# Patient Record
Sex: Female | Born: 1970 | Race: White | Hispanic: No | Marital: Married | Smoking: Never smoker
Health system: Southern US, Community
[De-identification: ages and names within clinical notes are randomized; demographics above are authoritative.]

## PROBLEM LIST (undated history)

## (undated) DIAGNOSIS — F419 Anxiety disorder, unspecified: Secondary | ICD-10-CM

## (undated) DIAGNOSIS — M1990 Unspecified osteoarthritis, unspecified site: Secondary | ICD-10-CM

## (undated) DIAGNOSIS — F32A Depression, unspecified: Secondary | ICD-10-CM

## (undated) DIAGNOSIS — T7840XA Allergy, unspecified, initial encounter: Secondary | ICD-10-CM

## (undated) HISTORY — PX: DILATION AND CURETTAGE OF UTERUS: SHX78

## (undated) HISTORY — DX: Allergy, unspecified, initial encounter: T78.40XA

## (undated) HISTORY — DX: Unspecified osteoarthritis, unspecified site: M19.90

## (undated) HISTORY — DX: Depression, unspecified: F32.A

## (undated) HISTORY — PX: MOUTH SURGERY: SHX715

## (undated) HISTORY — DX: Anxiety disorder, unspecified: F41.9

## (undated) HISTORY — PX: TONSILLECTOMY: SUR1361

---

## 2013-06-10 ENCOUNTER — Emergency Department: Payer: Self-pay | Admitting: Emergency Medicine

## 2013-06-10 LAB — URINALYSIS, COMPLETE
Bacteria: NONE SEEN
Bilirubin,UR: NEGATIVE
Glucose,UR: NEGATIVE mg/dL (ref 0–75)
LEUKOCYTE ESTERASE: NEGATIVE
Nitrite: NEGATIVE
Ph: 9 (ref 4.5–8.0)
Protein: NEGATIVE
Specific Gravity: 1.016 (ref 1.003–1.030)
Squamous Epithelial: <1

## 2013-06-10 LAB — CBC WITH DIFFERENTIAL/PLATELET
BASOS ABS: 0 10*3/uL (ref 0.0–0.1)
BASOS PCT: 0.2 %
Eosinophil #: 0 10*3/uL (ref 0.0–0.7)
Eosinophil %: 0.6 %
HCT: 39 % (ref 35.0–47.0)
HGB: 13.1 g/dL (ref 12.0–16.0)
Lymphocyte #: 0.2 10*3/uL — ABNORMAL LOW (ref 1.0–3.6)
Lymphocyte %: 3.4 %
MCH: 28.8 pg (ref 26.0–34.0)
MCHC: 33.5 g/dL (ref 32.0–36.0)
MCV: 86 fL (ref 80–100)
Monocyte #: 0.3 x10 3/mm (ref 0.2–0.9)
Monocyte %: 4.9 %
Neutrophil #: 6.2 10*3/uL (ref 1.4–6.5)
Neutrophil %: 90.9 %
PLATELETS: 139 10*3/uL — AB (ref 150–440)
RBC: 4.54 10*6/uL (ref 3.80–5.20)
RDW: 14.6 % — ABNORMAL HIGH (ref 11.5–14.5)
WBC: 6.9 10*3/uL (ref 3.6–11.0)

## 2013-06-10 LAB — COMPREHENSIVE METABOLIC PANEL
ALBUMIN: 3.6 g/dL (ref 3.4–5.0)
AST: 18 U/L (ref 15–37)
Alkaline Phosphatase: 90 U/L
Anion Gap: 8 (ref 7–16)
BILIRUBIN TOTAL: 0.9 mg/dL (ref 0.2–1.0)
BUN: 11 mg/dL (ref 7–18)
CALCIUM: 8.5 mg/dL (ref 8.5–10.1)
Chloride: 106 mmol/L (ref 98–107)
Co2: 25 mmol/L (ref 21–32)
Creatinine: 0.89 mg/dL (ref 0.60–1.30)
EGFR (African American): >60
EGFR (Non-African Amer.): >60
GLUCOSE: 111 mg/dL — AB (ref 65–99)
Osmolality: 278 (ref 275–301)
Potassium: 3.5 mmol/L (ref 3.5–5.1)
SGPT (ALT): 17 U/L (ref 12–78)
Sodium: 139 mmol/L (ref 136–145)
Total Protein: 7.4 g/dL (ref 6.4–8.2)

## 2013-06-10 LAB — LIPASE, BLOOD: Lipase: 136 U/L (ref 73–393)

## 2015-02-08 IMAGING — CT CT ABD-PELV W/ CM
2 of 5 series · 15 of 46 positions shown, 17 images · IV contrast (isovue)
Comparison: None.

CLINICAL DATA: Abdominal pain with severe cramping.  Fever.

EXAM:
CT ABDOMEN AND PELVIS WITH CONTRAST
TECHNIQUE: Multidetector CT imaging of the abdomen and pelvis was performed
using the standard protocol following bolus administration of
intravenous contrast.
CONTRAST:  100 cc Isovue 300 intravenous

[Series 2: routine abd pel with · axial · 0.64mm/px · z∈[-468,-68]mm · 12 of 90 slices shown, 14 images]
[im 5/90  soft-tissue]
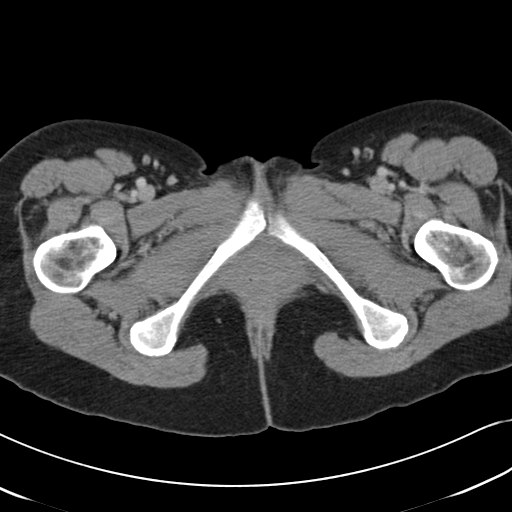
[im 5/90  bone]
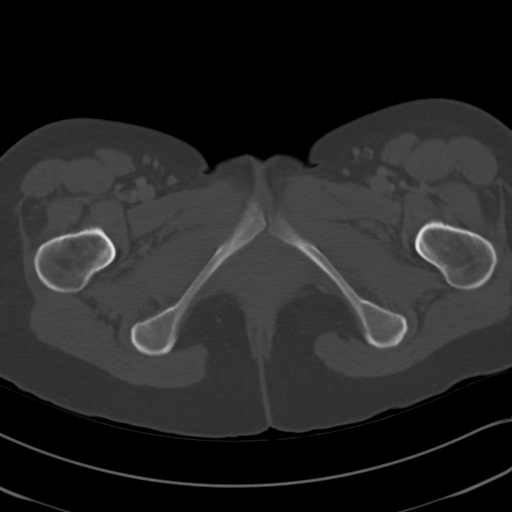
[im 15/90  soft-tissue]
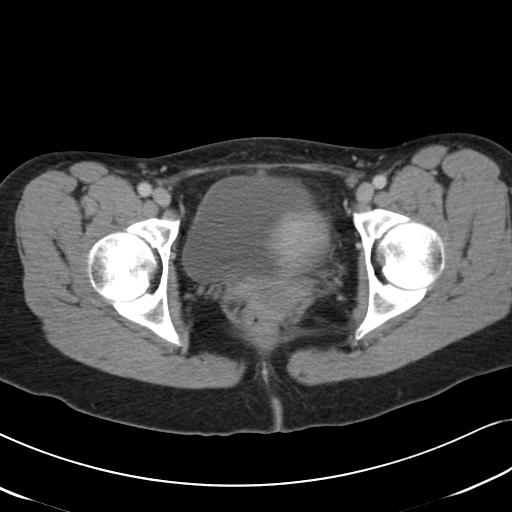
[im 19/90  soft-tissue]
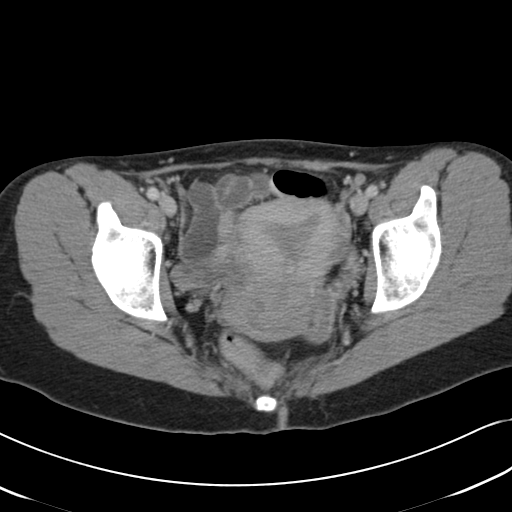
[im 29/90  soft-tissue]
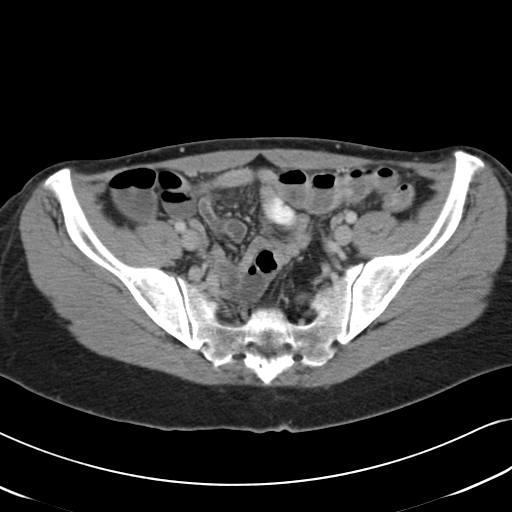
[im 33/90  soft-tissue]
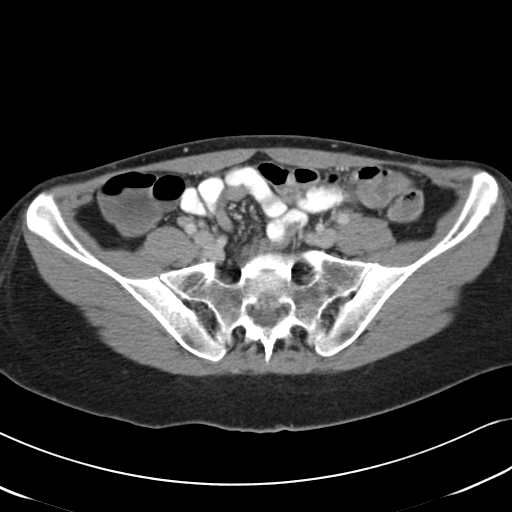
[im 43/90  soft-tissue]
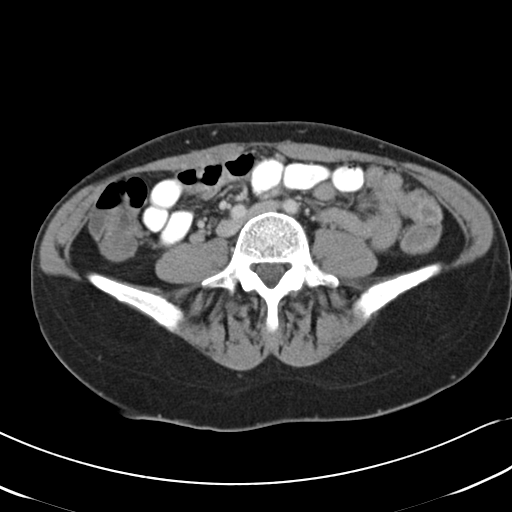
[im 47/90  soft-tissue]
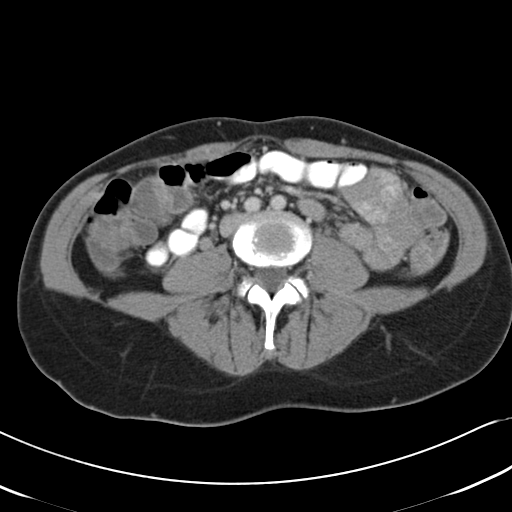
[im 57/90  soft-tissue]
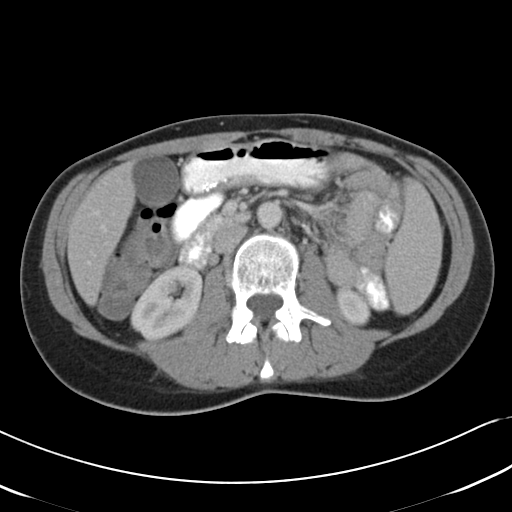
[im 61/90  soft-tissue]
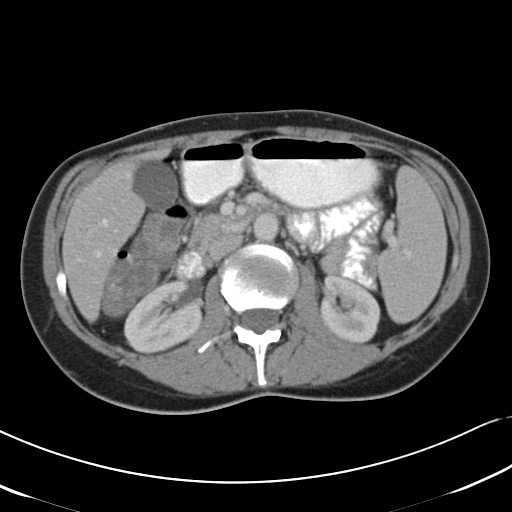
[im 61/90  bone]
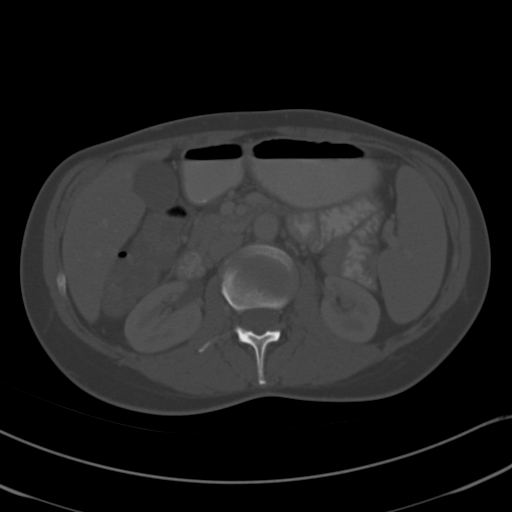
[im 71/90  soft-tissue]
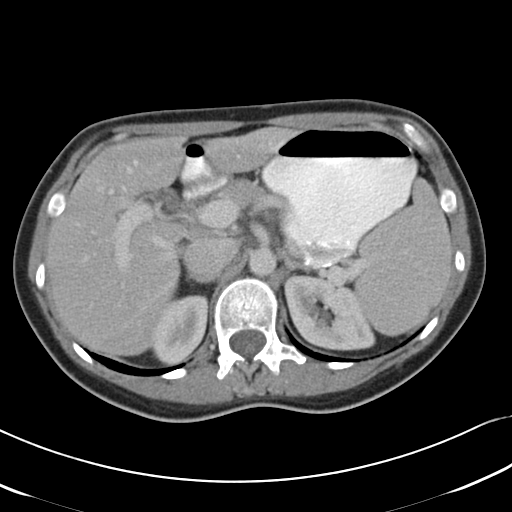
[im 75/90  soft-tissue]
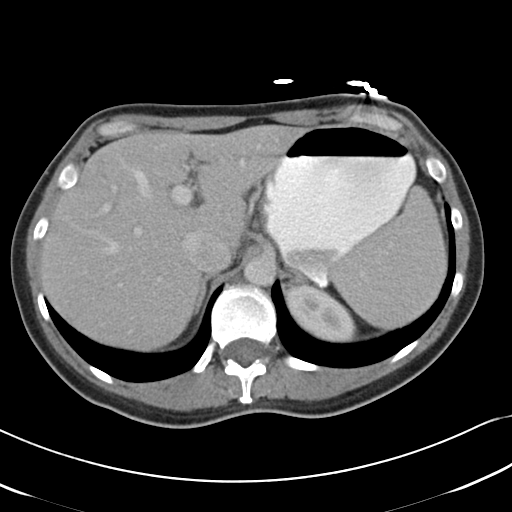
[im 85/90  soft-tissue]
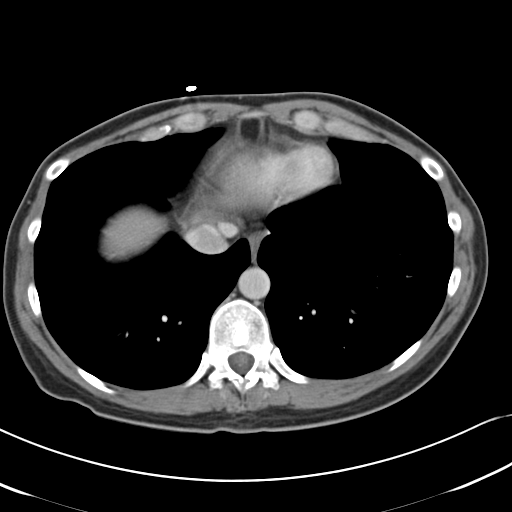

[Series 5: cor routine abd pel with · coronal · 0.60mm/px · 3 of 105 slices shown]
[im 35/105  soft-tissue]
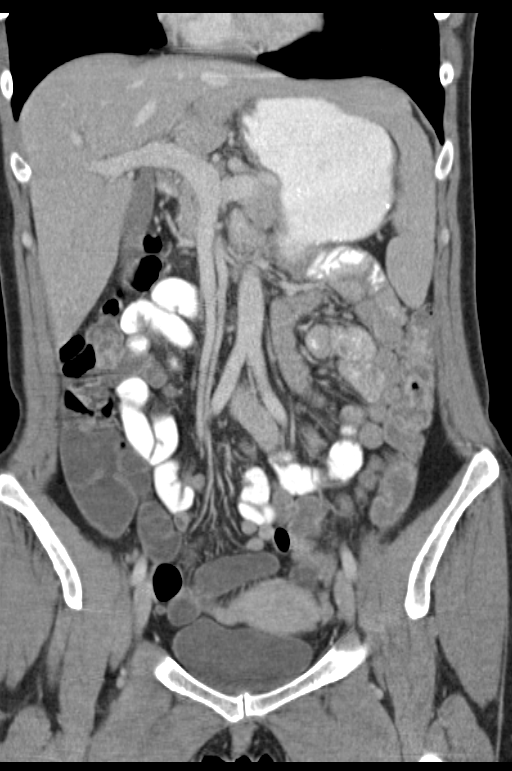
[im 47/105  soft-tissue]
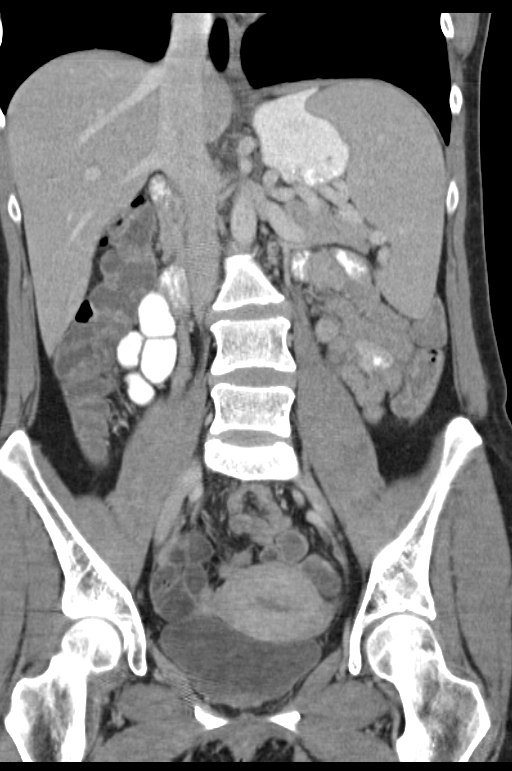
[im 58/105  soft-tissue]
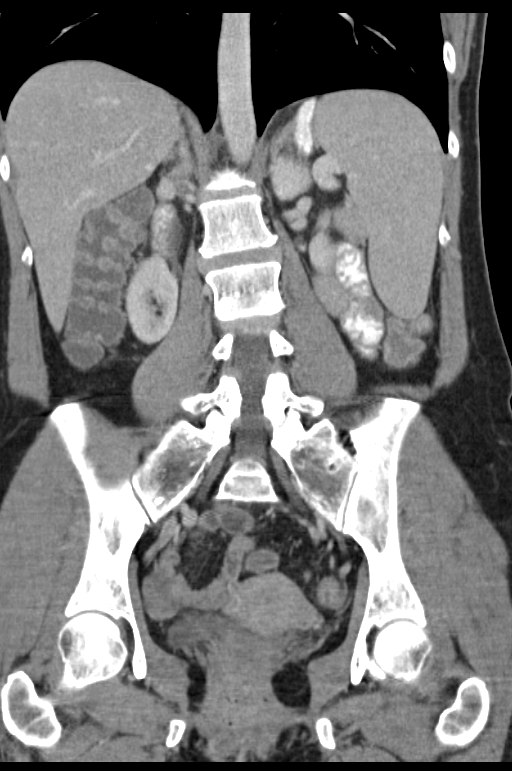

[15 of 46 positions shown; findings below may reference images not displayed]

FINDINGS: BODY WALL: Unremarkable.

LOWER CHEST: 4 mm pulmonary nodule in the anterior basilar segment
left lower lobe.

ABDOMEN/PELVIS:

Liver: No focal abnormality.

Biliary: No evidence of biliary obstruction or stone.

Pancreas: Unremarkable.

Spleen: Unremarkable.

Adrenals: Unremarkable.

Kidneys and ureters: No hydronephrosis or stone.

Bladder: Unremarkable.

Reproductive: There is an 8 mm focus of avid enhancement within the
central endometrial canal of the lower uterine segment.

Bowel: No obstruction. Normal appendix.

Retroperitoneum: No mass or adenopathy.

Peritoneum: No free fluid or gas.

Vascular: No acute abnormality.

OSSEOUS: No acute abnormalities.
IMPRESSION: 1. No acute intra-abdominal abnormality.
2. 8 mm enhancing nodule within the endometrial canal of the lower
uterine segment, which could represent a polyp or other focal
endometrial abnormality. Especially if there is dysfunctional
uterine bleeding, further evaluation with nonemergent pelvic
sonography recommended (preferably timed for shortly after menses).
3. 4 mm pulmonary nodule in the left lower lobe. Recommendations
noted below.

RECOMMENDATIONS:
If the patient is at high risk for bronchogenic carcinoma, follow-up
chest CT at 1 year is recommended. If the patient is at low risk, no
follow-up is needed. This recommendation follows the consensus
statement: Guidelines for Management of Small Pulmonary Nodules
Detected on CT Scans: A Statement from the [HOSPITAL] as

## 2019-02-27 LAB — COLOGUARD: Cologuard: NEGATIVE

## 2019-03-20 LAB — RESULTS CONSOLE HPV: CHL HPV: NEGATIVE

## 2021-01-21 DIAGNOSIS — F5104 Psychophysiologic insomnia: Secondary | ICD-10-CM | POA: Diagnosis not present

## 2021-01-21 DIAGNOSIS — F320 Major depressive disorder, single episode, mild: Secondary | ICD-10-CM | POA: Diagnosis not present

## 2021-05-21 ENCOUNTER — Encounter: Payer: Self-pay | Admitting: Internal Medicine

## 2021-05-21 ENCOUNTER — Other Ambulatory Visit: Payer: Self-pay

## 2021-05-21 ENCOUNTER — Ambulatory Visit (INDEPENDENT_AMBULATORY_CARE_PROVIDER_SITE_OTHER): Payer: BC Managed Care – PPO | Admitting: Internal Medicine

## 2021-05-21 VITALS — BP 109/68 | HR 72 | Temp 97.5°F | Ht 70.0 in | Wt 140.0 lb

## 2021-05-21 DIAGNOSIS — Z1231 Encounter for screening mammogram for malignant neoplasm of breast: Secondary | ICD-10-CM | POA: Diagnosis not present

## 2021-05-21 DIAGNOSIS — F5104 Psychophysiologic insomnia: Secondary | ICD-10-CM

## 2021-05-21 DIAGNOSIS — F419 Anxiety disorder, unspecified: Secondary | ICD-10-CM | POA: Diagnosis not present

## 2021-05-21 DIAGNOSIS — M7918 Myalgia, other site: Secondary | ICD-10-CM | POA: Insufficient documentation

## 2021-05-21 DIAGNOSIS — G4700 Insomnia, unspecified: Secondary | ICD-10-CM | POA: Insufficient documentation

## 2021-05-21 DIAGNOSIS — R1013 Epigastric pain: Secondary | ICD-10-CM

## 2021-05-21 DIAGNOSIS — Z0001 Encounter for general adult medical examination with abnormal findings: Secondary | ICD-10-CM | POA: Diagnosis not present

## 2021-05-21 DIAGNOSIS — F32A Depression, unspecified: Secondary | ICD-10-CM

## 2021-05-21 DIAGNOSIS — K591 Functional diarrhea: Secondary | ICD-10-CM

## 2021-05-21 DIAGNOSIS — R140 Abdominal distension (gaseous): Secondary | ICD-10-CM

## 2021-05-21 NOTE — Patient Instructions (Signed)
Health Maintenance for Postmenopausal Women ?Menopause is a normal process in which your ability to get pregnant comes to an end. This process happens slowly over many months or years, usually between the ages of 48 and 55. Menopause is complete when you have missed your menstrual period for 12 months. ?It is important to talk with your health care provider about some of the most common conditions that affect women after menopause (postmenopausal women). These include heart disease, cancer, and bone loss (osteoporosis). Adopting a healthy lifestyle and getting preventive care can help to promote your health and wellness. The actions you take can also lower your chances of developing some of these common conditions. ?What are the signs and symptoms of menopause? ?During menopause, you may have the following symptoms: ?Hot flashes. These can be moderate or severe. ?Night sweats. ?Decrease in sex drive. ?Mood swings. ?Headaches. ?Tiredness (fatigue). ?Irritability. ?Memory problems. ?Problems falling asleep or staying asleep. ?Talk with your health care provider about treatment options for your symptoms. ?Do I need hormone replacement therapy? ?Hormone replacement therapy is effective in treating symptoms that are caused by menopause, such as hot flashes and night sweats. ?Hormone replacement carries certain risks, especially as you become older. If you are thinking about using estrogen or estrogen with progestin, discuss the benefits and risks with your health care provider. ?How can I reduce my risk for heart disease and stroke? ?The risk of heart disease, heart attack, and stroke increases as you age. One of the causes may be a change in the body's hormones during menopause. This can affect how your body uses dietary fats, triglycerides, and cholesterol. Heart attack and stroke are medical emergencies. There are many things that you can do to help prevent heart disease and stroke. ?Watch your blood pressure ?High  blood pressure causes heart disease and increases the risk of stroke. This is more likely to develop in people who have high blood pressure readings or are overweight. ?Have your blood pressure checked: ?Every 3-5 years if you are 18-39 years of age. ?Every year if you are 40 years old or older. ?Eat a healthy diet ? ?Eat a diet that includes plenty of vegetables, fruits, low-fat dairy products, and lean protein. ?Do not eat a lot of foods that are high in solid fats, added sugars, or sodium. ?Get regular exercise ?Get regular exercise. This is one of the most important things you can do for your health. Most adults should: ?Try to exercise for at least 150 minutes each week. The exercise should increase your heart rate and make you sweat (moderate-intensity exercise). ?Try to do strengthening exercises at least twice each week. Do these in addition to the moderate-intensity exercise. ?Spend less time sitting. Even light physical activity can be beneficial. ?Other tips ?Work with your health care provider to achieve or maintain a healthy weight. ?Do not use any products that contain nicotine or tobacco. These products include cigarettes, chewing tobacco, and vaping devices, such as e-cigarettes. If you need help quitting, ask your health care provider. ?Know your numbers. Ask your health care provider to check your cholesterol and your blood sugar (glucose). Continue to have your blood tested as directed by your health care provider. ?Do I need screening for cancer? ?Depending on your health history and family history, you may need to have cancer screenings at different stages of your life. This may include screening for: ?Breast cancer. ?Cervical cancer. ?Lung cancer. ?Colorectal cancer. ?What is my risk for osteoporosis? ?After menopause, you may be   at increased risk for osteoporosis. Osteoporosis is a condition in which bone destruction happens more quickly than new bone creation. To help prevent osteoporosis or  the bone fractures that can happen because of osteoporosis, you may take the following actions: ?If you are 19-50 years old, get at least 1,000 mg of calcium and at least 600 international units (IU) of vitamin D per day. ?If you are older than age 50 but younger than age 70, get at least 1,200 mg of calcium and at least 600 international units (IU) of vitamin D per day. ?If you are older than age 70, get at least 1,200 mg of calcium and at least 800 international units (IU) of vitamin D per day. ?Smoking and drinking excessive alcohol increase the risk of osteoporosis. Eat foods that are rich in calcium and vitamin D, and do weight-bearing exercises several times each week as directed by your health care provider. ?How does menopause affect my mental health? ?Depression may occur at any age, but it is more common as you become older. Common symptoms of depression include: ?Feeling depressed. ?Changes in sleep patterns. ?Changes in appetite or eating patterns. ?Feeling an overall lack of motivation or enjoyment of activities that you previously enjoyed. ?Frequent crying spells. ?Talk with your health care provider if you think that you are experiencing any of these symptoms. ?General instructions ?See your health care provider for regular wellness exams and vaccines. This may include: ?Scheduling regular health, dental, and eye exams. ?Getting and maintaining your vaccines. These include: ?Influenza vaccine. Get this vaccine each year before the flu season begins. ?Pneumonia vaccine. ?Shingles vaccine. ?Tetanus, diphtheria, and pertussis (Tdap) booster vaccine. ?Your health care provider may also recommend other immunizations. ?Tell your health care provider if you have ever been abused or do not feel safe at home. ?Summary ?Menopause is a normal process in which your ability to get pregnant comes to an end. ?This condition causes hot flashes, night sweats, decreased interest in sex, mood swings, headaches, or lack  of sleep. ?Treatment for this condition may include hormone replacement therapy. ?Take actions to keep yourself healthy, including exercising regularly, eating a healthy diet, watching your weight, and checking your blood pressure and blood sugar levels. ?Get screened for cancer and depression. Make sure that you are up to date with all your vaccines. ?This information is not intended to replace advice given to you by your health care provider. Make sure you discuss any questions you have with your health care provider. ?Document Revised: 08/18/2020 Document Reviewed: 08/18/2020 ?Elsevier Patient Education ? 2022 [employer] ? ?

## 2021-05-21 NOTE — Progress Notes (Signed)
HPI  Pt presents to the clinic today to establish care and for management of the conditions listed below.  Chronic Muscle Pain: Generalized but mainly neck and back. She takes Flexeril at bedtime with good relief of symptoms. She stretches often.  Anxiety and Depression: Chronic, managed on Escitalopram. She is not currently seeing a therapist. She denies SI/HI.  Insomnia: She has difficulty falling asleep and staying asleep. She takes Lunesta with good relief of symptoms.   She also reports epigastric pain, bloating and intermittent diarrhea.  She feels like she has irritable bowel syndrome.  She denies reflux.  She has been treated for H. pylori in the past and reports this feels the same.  She is not currently taking any medication for this.  Flu: 01/2021 Tetanus: 06/2013 Covid: never Shingrix: 02/2020 Pap smear: 03/2019 Mammogram: 05/2020 Cologuard: 02/2019 Vision screening: annually Dentist: biannually  Diet: She does eat meat. She consumes fruits and veggies. She tries to avoid fried foods. She drinks mostly water and coffee. Exercise: Weights, walking  No past medical history on file.  Current Outpatient Medications  Medication Sig Dispense Refill   cyclobenzaprine (FLEXERIL) 10 MG tablet Take by mouth.     escitalopram (LEXAPRO) 20 MG tablet Take 20 mg by mouth daily.     Eszopiclone 3 MG TABS Take 3 mg by mouth at bedtime as needed.     Ibuprofen 200 MG CAPS Take by mouth.     L-Lysine 1000 MG TABS Take by mouth.     Magnesium 500 MG TABS Take by mouth.     No current facility-administered medications for this visit.    Allergies  Allergen Reactions   Diazepam Rash   Tetracycline Nausea Only    Family History  Problem Relation Age of Onset   Healthy Mother    Drug abuse Father    Depression Father    Depression Brother    Diabetes type II Brother    Hypertension Brother    Glaucoma Brother    Glaucoma Paternal Grandfather    Colon cancer Neg Hx    Breast  cancer Neg Hx    Ovarian cancer Neg Hx     Social History   Socioeconomic History   Marital status: Married    Spouse name: Not on file   Number of children: Not on file   Years of education: Not on file   Highest education level: Not on file  Occupational History   Not on file  Tobacco Use   Smoking status: Never   Smokeless tobacco: Never  Vaping Use   Vaping Use: Never used  Substance and Sexual Activity   Alcohol use: Not on file   Drug use: Never   Sexual activity: Not on file  Other Topics Concern   Not on file  Social History Narrative   Not on file   Social Determinants of Health   Financial Resource Strain: Not on file  Food Insecurity: Not on file  Transportation Needs: Not on file  Physical Activity: Not on file  Stress: Not on file  Social Connections: Not on file  Intimate Partner Violence: Not on file    ROS:  Constitutional: Denies fever, malaise, fatigue, headache or abrupt weight changes.  HEENT: Denies eye pain, eye redness, ear pain, ringing in the ears, wax buildup, runny nose, nasal congestion, bloody nose, or sore throat. Respiratory: Denies difficulty breathing, shortness of breath, cough or sputum production.   Cardiovascular: Denies chest pain, chest tightness, palpitations or swelling  in the hands or feet.  Gastrointestinal: Patient reports epigastric pain, bloating and diarrhea.  Denies constipation, or blood in the stool.  GU: Denies frequency, urgency, pain with urination, blood in urine, odor or discharge. Musculoskeletal: Patient reports chronic muscle pain.  Denies decrease in range of motion, difficulty with gait, or joint pain and swelling.  Skin: Denies redness, rashes, lesions or ulcercations.  Neurological: Patient reports insomnia.  Denies dizziness, difficulty with memory, difficulty with speech or problems with balance and coordination.  Psych: Patient has a history of anxiety and depression.  Denies SI/HI.  No other specific  complaints in a complete review of systems (except as listed in HPI above).  PE:  BP 109/68 (BP Location: Left Arm, Patient Position: Sitting, Cuff Size: Normal)    Pulse 72    Temp (!) 97.5 F (36.4 C) (Temporal)    Ht $R'5\' 10"'DB$  (1.778 m)    Wt 140 lb (63.5 kg)    SpO2 100%    BMI 20.09 kg/m  Wt Readings from Last 3 Encounters:  05/21/21 140 lb (63.5 kg)    General: Appears her stated age, well developed, well nourished in NAD. HEENT: Head: normal shape and size; Eyes: EOMs intact;  Neck: Neck supple, trachea midline. No masses, lumps or thyromegaly present.  Cardiovascular: Normal rate and rhythm. S1,S2 noted.  No murmur, rubs or gallops noted. No JVD or BLE edema. No carotid bruits noted. Pulmonary/Chest: Normal effort and positive vesicular breath sounds. No respiratory distress. No wheezes, rales or ronchi noted.  Abdomen: Soft and nontender. Normal bowel sound.. No distention or masses noted. Liver, spleen and kidneys non palpable. Musculoskeletal: Strength 5/5 BUE/BLE. No difficulty with gait.  Neurological: Alert and oriented. Cranial nerves II-XII grossly intact. Coordination normal.  Psychiatric: Mood and affect normal. Behavior is normal. Judgment and thought content normal.    BMET    Component Value Date/Time   NA 139 06/10/2013 2005   K 3.5 06/10/2013 2005   CL 106 06/10/2013 2005   CO2 25 06/10/2013 2005   GLUCOSE 111 (H) 06/10/2013 2005   BUN 11 06/10/2013 2005   CREATININE 0.89 06/10/2013 2005   CALCIUM 8.5 06/10/2013 2005   GFRNONAA >60 06/10/2013 2005   GFRAA >60 06/10/2013 2005    Lipid Panel  No results found for: CHOL, TRIG, HDL, CHOLHDL, VLDL, LDLCALC  CBC    Component Value Date/Time   WBC 6.9 06/10/2013 2005   RBC 4.54 06/10/2013 2005   HGB 13.1 06/10/2013 2005   HCT 39.0 06/10/2013 2005   PLT 139 (L) 06/10/2013 2005   MCV 86 06/10/2013 2005   MCH 28.8 06/10/2013 2005   MCHC 33.5 06/10/2013 2005   RDW 14.6 (H) 06/10/2013 2005   LYMPHSABS 0.2  (L) 06/10/2013 2005   MONOABS 0.3 06/10/2013 2005   EOSABS 0.0 06/10/2013 2005   BASOSABS 0.0 06/10/2013 2005    Hgb A1C No results found for: HGBA1C   Assessment and Plan:  Preventative Health Maintenance:  Flu shot UTD Tetanus UTD Encouraged her to get her COVID-vaccine Advised her to get her second Shingrix vaccine Pap smear UTD Mammogram ordered-she will call to schedule Colon screening due 02/2022 Encouraged her to consume a balanced diet and exercise regimen Advised her to see an eye doctor and dentist annually We will check CBC, c-Met, lipid, A1c today  Epigastric Pain, Abdominal Bloating, dDarrhea:  Could have 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 pylori today  RTC in 6 months,  follow-up chronic conditions Webb Silversmith, NP This visit occurred during the SARS-CoV-2 public health emergency.  Safety protocols were in place, including screening questions prior to the visit, additional usage of staff PPE, and extensive cleaning of exam room while observing appropriate contact time as indicated for disinfecting solutions.

## 2021-05-21 NOTE — Assessment & Plan Note (Signed)
Encourage daily stretching Continue Flexeril as needed

## 2021-05-21 NOTE — Assessment & Plan Note (Signed)
Continue Lunesta We will monitor

## 2021-05-21 NOTE — Assessment & Plan Note (Signed)
Controlled on Escitalopram Support offered 

## 2021-05-22 ENCOUNTER — Other Ambulatory Visit: Payer: Self-pay

## 2021-05-22 DIAGNOSIS — Z0001 Encounter for general adult medical examination with abnormal findings: Secondary | ICD-10-CM

## 2021-05-22 LAB — COMPLETE METABOLIC PANEL WITH GFR
AG Ratio: 1.8 (calc) (ref 1.0–2.5)
ALT: 12 U/L (ref 6–29)
AST: 19 U/L (ref 10–35)
Albumin: 4.3 g/dL (ref 3.6–5.1)
Alkaline phosphatase (APISO): 100 U/L (ref 37–153)
BUN: 9 mg/dL (ref 7–25)
CO2: 32 mmol/L (ref 20–32)
Calcium: 9.5 mg/dL (ref 8.6–10.4)
Chloride: 103 mmol/L (ref 98–110)
Creat: 0.94 mg/dL (ref 0.50–1.03)
Globulin: 2.4 g/dL (calc) (ref 1.9–3.7)
Glucose, Bld: 88 mg/dL (ref 65–99)
Potassium: 4.4 mmol/L (ref 3.5–5.3)
Sodium: 139 mmol/L (ref 135–146)
Total Bilirubin: 0.8 mg/dL (ref 0.2–1.2)
Total Protein: 6.7 g/dL (ref 6.1–8.1)
eGFR: 73 mL/min/{1.73_m2} (ref >=60)

## 2021-05-22 LAB — H. PYLORI BREATH TEST: H. pylori Breath Test: NOT DETECTED

## 2021-05-22 NOTE — Addendum Note (Signed)
Addended by: Lorre Munroe on: 05/22/2021 03:17 PM   Modules accepted: Orders

## 2021-06-24 ENCOUNTER — Encounter: Payer: Self-pay | Admitting: Internal Medicine

## 2021-08-25 ENCOUNTER — Encounter: Payer: Self-pay | Admitting: Internal Medicine

## 2021-08-26 DIAGNOSIS — Z1231 Encounter for screening mammogram for malignant neoplasm of breast: Secondary | ICD-10-CM | POA: Diagnosis not present

## 2021-08-26 MED ORDER — ESZOPICLONE 3 MG PO TABS: 3.0000 mg | ORAL_TABLET | Freq: Every evening | ORAL | 0 refills | Status: DC | PRN

## 2021-08-26 MED ORDER — CYCLOBENZAPRINE HCL 10 MG PO TABS: 10.0000 mg | ORAL_TABLET | Freq: Every day | ORAL | 0 refills | Status: DC | PRN

## 2021-08-26 MED ORDER — ESCITALOPRAM OXALATE 20 MG PO TABS: 20.0000 mg | ORAL_TABLET | Freq: Every day | ORAL | 1 refills | Status: DC

## 2021-10-12 ENCOUNTER — Other Ambulatory Visit: Payer: Self-pay | Admitting: Internal Medicine

## 2021-10-14 NOTE — Telephone Encounter (Signed)
Requested medication (s) are due for refill today: Yes  Requested medication (s) are on the active medication list: Yes  Last refill:  08/26/21  Future visit scheduled: Yes  Notes to clinic:  See request.    Requested Prescriptions  Pending Prescriptions Disp Refills   Eszopiclone 3 MG TABS [Pharmacy Med Name: ESZOPICLONE  TAB 3MG ] 90 tablet 0    Sig: TAKE 1 TABLET AT BEDTIME ASNEEDED     Not Delegated - Psychiatry:  Anxiolytics/Hypnotics Failed - 10/12/2021  2:59 PM      Failed - This refill cannot be delegated      Failed - Urine Drug Screen completed in last 360 days      Passed - Valid encounter within last 6 months    Recent Outpatient Visits           4 months ago Encounter for general adult medical examination with abnormal findings   [facility], NP       Future Appointments             In 1 month Baity, Salvadore Oxford, NP [facility], [facility]             cyclobenzaprine (FLEXERIL) 10 MG tablet [Pharmacy Med Name: CYCLOBENZAPR TAB 10MG ] 30 tablet 0    Sig: TAKE 1 TABLET DAILY AS     NEEDED FOR MUSCLE SPASMS     Not Delegated - Analgesics:  Muscle Relaxants Failed - 10/12/2021  2:59 PM      Failed - This refill cannot be delegated      Passed - Valid encounter within last 6 months    Recent Outpatient Visits           4 months ago Encounter for general adult medical examination with abnormal findings   [facility], [facility], NP       Future Appointments             In 1 month [place], Salvadore Oxford, NP [facility], [facility]

## 2021-11-01 ENCOUNTER — Other Ambulatory Visit: Payer: Self-pay | Admitting: Internal Medicine

## 2021-11-03 NOTE — Telephone Encounter (Signed)
Requested medication (s) are due for refill today - no  Requested medication (s) are on the active medication list -yes  Future visit scheduled -yes  Last refill: 10/15/21 #30  Notes to clinic: non delegated Rx  Requested Prescriptions  Pending Prescriptions Disp Refills   cyclobenzaprine (FLEXERIL) 10 MG tablet [Pharmacy Med Name: CYCLOBENZAPR TAB 10MG ] 30 tablet 0    Sig: TAKE 1 TABLET DAILY AS     NEEDED FOR MUSCLE SPASMS     Not Delegated - Analgesics:  Muscle Relaxants Failed - 11/01/2021  8:11 PM      Failed - This refill cannot be delegated      Passed - Valid encounter within last 6 months    Recent Outpatient Visits           5 months ago Encounter for general adult medical examination with abnormal findings   [facility], NP       Future Appointments             In 4 weeks Salvadore Oxford, Sampson Si, NP [facility], [facility]               Requested Prescriptions  Pending Prescriptions Disp Refills   cyclobenzaprine (FLEXERIL) 10 MG tablet [Pharmacy Med Name: CYCLOBENZAPR TAB 10MG ] 30 tablet 0    Sig: TAKE 1 TABLET DAILY AS     NEEDED FOR MUSCLE SPASMS     Not Delegated - Analgesics:  Muscle Relaxants Failed - 11/01/2021  8:11 PM      Failed - This refill cannot be delegated      Passed - Valid encounter within last 6 months    Recent Outpatient Visits           5 months ago Encounter for general adult medical examination with abnormal findings   [facility], [facility], NP       Future Appointments             In 4 weeks [place], Salvadore Oxford, NP [facility], [facility]

## 2021-12-01 ENCOUNTER — Encounter: Payer: Self-pay | Admitting: Internal Medicine

## 2021-12-01 ENCOUNTER — Ambulatory Visit (INDEPENDENT_AMBULATORY_CARE_PROVIDER_SITE_OTHER): Payer: BC Managed Care – PPO | Admitting: Internal Medicine

## 2021-12-01 VITALS — BP 118/82 | HR 74 | Temp 97.3°F | Wt 151.0 lb

## 2021-12-01 DIAGNOSIS — Z1211 Encounter for screening for malignant neoplasm of colon: Secondary | ICD-10-CM | POA: Diagnosis not present

## 2021-12-01 DIAGNOSIS — Z0001 Encounter for general adult medical examination with abnormal findings: Secondary | ICD-10-CM | POA: Diagnosis not present

## 2021-12-01 DIAGNOSIS — R7309 Other abnormal glucose: Secondary | ICD-10-CM | POA: Diagnosis not present

## 2021-12-01 DIAGNOSIS — K580 Irritable bowel syndrome with diarrhea: Secondary | ICD-10-CM

## 2021-12-01 DIAGNOSIS — F5104 Psychophysiologic insomnia: Secondary | ICD-10-CM

## 2021-12-01 DIAGNOSIS — M7918 Myalgia, other site: Secondary | ICD-10-CM | POA: Diagnosis not present

## 2021-12-01 DIAGNOSIS — F419 Anxiety disorder, unspecified: Secondary | ICD-10-CM

## 2021-12-01 DIAGNOSIS — F32A Depression, unspecified: Secondary | ICD-10-CM

## 2021-12-01 DIAGNOSIS — K589 Irritable bowel syndrome without diarrhea: Secondary | ICD-10-CM | POA: Insufficient documentation

## 2021-12-01 NOTE — Assessment & Plan Note (Signed)
Continue Lunesta 

## 2021-12-01 NOTE — Assessment & Plan Note (Signed)
Continue escitalopram Support offered 

## 2021-12-01 NOTE — Assessment & Plan Note (Signed)
Continue cyclobenzaprine and stretching

## 2021-12-01 NOTE — Progress Notes (Signed)
Subjective:    Patient ID: Briana Bauer, female    DOB: [birth year], 51 y.o.   MRN: [id number]  HPI  Patient presents to clinic today for 65-month follow-up of chronic conditions.  Fibromyalgia: Managed with Cyclobenzaprine at bedtime with good relief of symptoms.  She reports she stretches often.  Anxiety and Depression: Chronic, managed on Escitalopram.  She is not currently seeing a therapist.  She denies SI/HI.  Insomnia: She has difficulty falling and staying asleep.  She takes Lunesta with good relief of symptoms.  There is no sleep study on file.  IBS: Mainly diarrhea. She tries to manage this with diet. There is no colonoscopy on file.  She does note that she recently had dental implant surgery.  She was on Dexamethasone and Amoxicillin.  She reports she has felt "funny".  She does not have a follow-up with the oral surgeon until December.  Review of Systems     No past medical history on file.  Current Outpatient Medications  Medication Sig Dispense Refill   cyclobenzaprine (FLEXERIL) 10 MG tablet TAKE 1 TABLET DAILY AS     NEEDED FOR MUSCLE SPASMS 30 tablet 0   escitalopram (LEXAPRO) 20 MG tablet Take 1 tablet (20 mg total) by mouth daily. 90 tablet 1   Eszopiclone 3 MG TABS TAKE 1 TABLET AT BEDTIME ASNEEDED 90 tablet 0   Ibuprofen 200 MG CAPS Take by mouth.     L-Lysine 1000 MG TABS Take by mouth.     Magnesium 500 MG TABS Take by mouth.     No current facility-administered medications for this visit.    Allergies  Allergen Reactions   Diazepam Rash   Tetracycline Nausea Only    Family History  Problem Relation Age of Onset   Healthy Mother    Drug abuse Father    Depression Father    Depression Brother    Diabetes type II Brother    Hypertension Brother    Glaucoma Brother    Glaucoma Paternal Grandfather    Colon cancer Neg Hx    Breast cancer Neg Hx    Ovarian cancer Neg Hx     Social History   Socioeconomic History   Marital status: Married     Spouse name: Not on file   Number of children: Not on file   Years of education: Not on file   Highest education level: Not on file  Occupational History   Not on file  Tobacco Use   Smoking status: Never   Smokeless tobacco: Never  Vaping Use   Vaping Use: Never used  Substance and Sexual Activity   Alcohol use: Not on file   Drug use: Never   Sexual activity: Not on file  Other Topics Concern   Not on file  Social History Narrative   Not on file   Social Determinants of Health   Financial Resource Strain: Not on file  Food Insecurity: Not on file  Transportation Needs: Not on file  Physical Activity: Not on file  Stress: Not on file  Social Connections: Not on file  Intimate Partner Violence: Not on file     Constitutional: Denies fever, malaise, fatigue, headache or abrupt weight changes.  HEENT: Denies eye pain, eye redness, ear pain, ringing in the ears, wax buildup, runny nose, nasal congestion, bloody nose, or sore throat. Respiratory: Denies difficulty breathing, shortness of breath, cough or sputum production.   Cardiovascular: Denies chest pain, chest tightness, palpitations or swelling in the  hands or feet.  Gastrointestinal: Patient reports intermittent diarrhea.  Denies abdominal pain, bloating, constipation, or blood in the stool.  GU: Denies urgency, frequency, pain with urination, burning sensation, blood in urine, odor or discharge. Musculoskeletal: Patient reports chronic muscle pain.  Denies decrease in range of motion, difficulty with gait, or joint pain and swelling.  Skin: Denies redness, rashes, lesions or ulcercations.  Neurological: Patient reports insomnia.  Denies dizziness, difficulty with memory, difficulty with speech or problems with balance and coordination.  Psych: Patient has a history of anxiety and depression.  Denies SI/HI.  No other specific complaints in a complete review of systems (except as listed in HPI above).  Objective:    Physical Exam BP 118/82 (BP Location: Left Arm, Patient Position: Sitting, Cuff Size: Normal)   Pulse 74   Temp (!) 97.3 F (36.3 C) (Temporal)   Wt 151 lb (68.5 kg)   SpO2 99%   BMI 21.67 kg/m   Wt Readings from Last 3 Encounters:  05/21/21 140 lb (63.5 kg)    General: Appears her stated age, well developed, well nourished in NAD. Skin: Warm, dry and intact.  Cardiovascular: Normal rate and rhythm. S1,S2 noted.  No murmur, rubs or gallops noted.  Pulmonary/Chest: Normal effort and positive vesicular breath sounds. No respiratory distress. No wheezes, rales or ronchi noted.  Abdomen: Normal bowel sounds. Musculoskeletal: Strength 5/5 BUE/BLE.  No difficulty with gait.  Neurological: Alert and oriented.  Psychiatric: Mood and affect normal. Behavior is normal. Judgment and thought content normal.    BMET    Component Value Date/Time   NA 139 05/21/2021 1114   NA 139 06/10/2013 2005   K 4.4 05/21/2021 1114   K 3.5 06/10/2013 2005   CL 103 05/21/2021 1114   CL 106 06/10/2013 2005   CO2 32 05/21/2021 1114   CO2 25 06/10/2013 2005   GLUCOSE 88 05/21/2021 1114   GLUCOSE 111 (H) 06/10/2013 2005   BUN 9 05/21/2021 1114   BUN 11 06/10/2013 2005   CREATININE 0.94 05/21/2021 1114   CALCIUM 9.5 05/21/2021 1114   CALCIUM 8.5 06/10/2013 2005   GFRNONAA >60 06/10/2013 2005   GFRAA >60 06/10/2013 2005    Lipid Panel  No results found for: "CHOL", "TRIG", "HDL", "CHOLHDL", "VLDL", "LDLCALC"  CBC    Component Value Date/Time   WBC 6.9 06/10/2013 2005   RBC 4.54 06/10/2013 2005   HGB 13.1 06/10/2013 2005   HCT 39.0 06/10/2013 2005   PLT 139 (L) 06/10/2013 2005   MCV 86 06/10/2013 2005   MCH 28.8 06/10/2013 2005   MCHC 33.5 06/10/2013 2005   RDW 14.6 (H) 06/10/2013 2005   LYMPHSABS 0.2 (L) 06/10/2013 2005   MONOABS 0.3 06/10/2013 2005   EOSABS 0.0 06/10/2013 2005   BASOSABS 0.0 06/10/2013 2005    Hgb A1C No results found for: "HGBA1C"           Assessment &  Plan:     RTC in 6 months for annual exam Nicki Reaper, NP

## 2021-12-01 NOTE — Assessment & Plan Note (Signed)
Managed with diet She is interested in colonoscopy at this time but is agreeable to Cologuard

## 2021-12-01 NOTE — Patient Instructions (Addendum)
Diet for Irritable Bowel Syndrome When you have irritable bowel syndrome (IBS), it is very important to follow the eating habits that are best for your condition. IBS may cause various symptoms, such as pain in the abdomen, constipation, or diarrhea. Choosing the right foods can help to ease the discomfort from these symptoms. Work with your health care provider and dietitian to find the eating plan that will help to control your symptoms. What are tips for following this plan?  Keep a food diary. This will help you identify foods that cause symptoms. Write down: What you eat and when you eat it. What symptoms you have. When symptoms occur in relation to your meals, such as "pain in abdomen 2 hours after dinner." Eat your meals slowly and in a relaxed setting. Aim to eat 5-6 small meals per day. Do not skip meals. Drink enough fluid to keep your urine pale yellow. Ask your health care provider if you should take an over-the-counter probiotic to help restore healthy bacteria in your gut (digestive tract). Probiotics are foods that contain good bacteria and yeasts. Your dietitian may have specific dietary recommendations for you based on your symptoms. Your dietitian may recommend that you: Avoid foods that cause symptoms. Talk with your dietitian about other ways to get the same nutrients that are in those problem foods. Avoid foods with gluten. Gluten is a protein that is found in rye, wheat, and barley. Eat more foods that contain soluble fiber. Examples of foods with high soluble fiber include oats, seeds, and certain fruits and vegetables. Take a fiber supplement if told by your dietitian. Reduce or avoid certain foods called FODMAPs. These are foods that contain sugars that are hard for some people to digest. Ask your health care provider which foods to avoid. What foods should I avoid? The following are some foods and drinks that may make your symptoms worse: Fatty foods, such as french  fries. Foods that contain gluten, such as pasta and cereal. Dairy products, such as milk, cheese, and ice cream. Spicy foods. Alcohol. Products with caffeine, such as coffee, tea, or chocolate. Carbonated drinks, such as soda. Foods that are high in FODMAPs. These include certain fruits and vegetables. Products with sweeteners such as honey, high fructose corn syrup, sorbitol, and mannitol. The items listed above may not be a complete list of foods and beverages you should avoid. Contact a dietitian for more information. What foods are good sources of fiber? Your health care provider or dietitian may recommend that you eat more foods that contain fiber. Fiber can help to reduce constipation and other IBS symptoms. Add foods with fiber to your diet a little at a time so your body can get used to them. Too much fiber at one time might cause gas and swelling of your abdomen. The following are some foods that are good sources of fiber: Berries, such as raspberries, strawberries, and blueberries. Tomatoes. Carrots. Brown rice. Oats. Seeds, such as chia and pumpkin seeds. The items listed above may not be a complete list of recommended sources of fiber. Contact your dietitian for more options. Where to find more information International Foundation for Functional Gastrointestinal Disorders: aboutibs.org National Institute of Diabetes and Digestive and Kidney Diseases: niddk.nih.gov Summary When you have irritable bowel syndrome (IBS), it is very important to follow the eating habits that are best for your condition. IBS may cause various symptoms, such as pain in the abdomen, constipation, or diarrhea. Choosing the right foods can help to ease the   discomfort that comes from symptoms. Your health care provider or dietitian may recommend that you eat more foods that contain fiber. Keep a food diary. This will help you identify foods that cause symptoms. This information is not intended to replace  advice given to you by your health care provider. Make sure you discuss any questions you have with your health care provider. Document Revised: 03/10/2021 Document Reviewed: 03/10/2021 Elsevier Patient Education  2023 Elsevier Inc.  

## 2021-12-02 LAB — LIPID PANEL
Cholesterol: 175 mg/dL (ref <200)
HDL: 49 mg/dL — ABNORMAL LOW (ref >=50)
LDL Cholesterol (Calc): 102 mg/dL (calc) — ABNORMAL HIGH
Non-HDL Cholesterol (Calc): 126 mg/dL (calc) (ref <130)
Total CHOL/HDL Ratio: 3.6 (calc) (ref <5.0)
Triglycerides: 140 mg/dL (ref <150)

## 2021-12-02 LAB — TEST AUTHORIZATION

## 2021-12-02 LAB — CBC
HCT: 45.7 % — ABNORMAL HIGH (ref 35.0–45.0)
Hemoglobin: 15.4 g/dL (ref 11.7–15.5)
MCH: 30.5 pg (ref 27.0–33.0)
MCHC: 33.7 g/dL (ref 32.0–36.0)
MCV: 90.5 fL (ref 80.0–100.0)
MPV: 10.9 fL (ref 7.5–12.5)
Platelets: 192 10*3/uL (ref 140–400)
RBC: 5.05 10*6/uL (ref 3.80–5.10)
RDW: 12.7 % (ref 11.0–15.0)
WBC: 6.3 10*3/uL (ref 3.8–10.8)

## 2021-12-02 LAB — HEMOGLOBIN A1C
Hgb A1c MFr Bld: 5.1 % of total Hgb (ref <5.7)
Mean Plasma Glucose: 100 mg/dL
eAG (mmol/L): 5.5 mmol/L

## 2022-01-18 ENCOUNTER — Other Ambulatory Visit: Payer: Self-pay | Admitting: Internal Medicine

## 2022-01-19 NOTE — Telephone Encounter (Signed)
Rx 08/26/21 #90 1RF- too soon Requested Prescriptions  Pending Prescriptions Disp Refills  . escitalopram (LEXAPRO) 20 MG tablet [Pharmacy Med Name: ESCITALOPRAM TAB 20MG ] 90 tablet 1    Sig: TAKE 1 TABLET DAILY     Psychiatry:  Antidepressants - SSRI Passed - 01/18/2022  8:07 PM      Passed - Completed PHQ-2 or PHQ-9 in the last 360 days      Passed - Valid encounter within last 6 months    Recent Outpatient Visits          1 month ago Screening for colon cancer   [place], NP   8 months ago Encounter for general adult medical examination with abnormal findings   [facility] [place], Coralie Keens, NP

## 2022-01-27 ENCOUNTER — Other Ambulatory Visit: Payer: Self-pay | Admitting: Internal Medicine

## 2022-01-28 MED ORDER — ESZOPICLONE 3 MG PO TABS: 3.0000 mg | ORAL_TABLET | Freq: Every day | ORAL | 0 refills | Status: DC

## 2022-02-03 ENCOUNTER — Encounter: Payer: Self-pay | Admitting: Internal Medicine

## 2022-02-03 ENCOUNTER — Other Ambulatory Visit: Payer: Self-pay | Admitting: Internal Medicine

## 2022-02-04 ENCOUNTER — Other Ambulatory Visit: Payer: Self-pay

## 2022-02-04 MED ORDER — ESCITALOPRAM OXALATE 20 MG PO TABS: 20.0000 mg | ORAL_TABLET | Freq: Every day | ORAL | 0 refills | Status: DC

## 2022-03-09 DIAGNOSIS — Z1211 Encounter for screening for malignant neoplasm of colon: Secondary | ICD-10-CM | POA: Diagnosis not present

## 2022-03-17 LAB — COLOGUARD: COLOGUARD: NEGATIVE

## 2022-05-24 ENCOUNTER — Other Ambulatory Visit: Payer: Self-pay | Admitting: Internal Medicine

## 2022-05-24 NOTE — Telephone Encounter (Signed)
Requested Prescriptions  Pending Prescriptions Disp Refills   escitalopram (LEXAPRO) 20 MG tablet [Pharmacy Med Name: ESCITALOPRAM TAB 20MG] 90 tablet 0    Sig: TAKE 1 TABLET DAILY     Psychiatry:  Antidepressants - SSRI Failed - 05/24/2022  2:15 AM      Failed - Completed PHQ-2 or PHQ-9 in the last 360 days      Passed - Valid encounter within last 6 months    Recent Outpatient Visits           5 months ago Screening for colon cancer   [facility] [place], Coralie Keens, NP   1 year ago Encounter for general adult medical examination with abnormal findings   [facility] [place], Coralie Keens, NP       Future Appointments             Tomorrow Garnette Gunner, Coralie Keens, NP [facility], [facility]

## 2022-05-25 ENCOUNTER — Encounter: Payer: Self-pay | Admitting: Internal Medicine

## 2022-05-25 ENCOUNTER — Ambulatory Visit (INDEPENDENT_AMBULATORY_CARE_PROVIDER_SITE_OTHER): Payer: BC Managed Care – PPO | Admitting: Internal Medicine

## 2022-05-25 VITALS — BP 112/60 | HR 62 | Temp 96.6°F | Ht 70.0 in | Wt 143.0 lb

## 2022-05-25 DIAGNOSIS — R7309 Other abnormal glucose: Secondary | ICD-10-CM

## 2022-05-25 DIAGNOSIS — Z0001 Encounter for general adult medical examination with abnormal findings: Secondary | ICD-10-CM | POA: Diagnosis not present

## 2022-05-25 DIAGNOSIS — Z1231 Encounter for screening mammogram for malignant neoplasm of breast: Secondary | ICD-10-CM | POA: Diagnosis not present

## 2022-05-25 MED ORDER — CYCLOBENZAPRINE HCL 10 MG PO TABS: ORAL_TABLET | ORAL | 0 refills | Status: DC

## 2022-05-25 NOTE — Patient Instructions (Signed)

## 2022-05-25 NOTE — Progress Notes (Signed)
Subjective:    Patient ID: Briana Bauer, female    DOB: Nov 29, 1970, 52 y.o.   MRN: [id number]  HPI  Patient presents to clinic today for her annual exam.  Flu: 01/2021 Tetanus: 06/2013 COVID: never Shingrix: 02/2020 Pap smear: 03/2019 Mammogram: 08/2021 Colon screening: 02/2022, Cologuard Vision screening: as needed Dentist: biannually  Diet: She does eat meat. She consumes fruits and veggies. She tries to avoid fried foods. She drinks mostly water and coffee. Exercise: 20 minutes at home  Review of Systems     No past medical history on file.  Current Outpatient Medications  Medication Sig Dispense Refill   amoxicillin (AMOXIL) 500 MG capsule Take 500 mg by mouth 3 (three) times daily.     cyclobenzaprine (FLEXERIL) 10 MG tablet TAKE 1 TABLET DAILY AS     NEEDED FOR MUSCLE SPASMS 30 tablet 0   escitalopram (LEXAPRO) 20 MG tablet TAKE 1 TABLET DAILY 90 tablet 0   Eszopiclone 3 MG TABS Take 1 tablet (3 mg total) by mouth at bedtime. Take immediately before bedtime 90 tablet 0   Ibuprofen 200 MG CAPS Take by mouth.     L-Lysine 1000 MG TABS Take by mouth.     Magnesium 500 MG TABS Take by mouth.     No current facility-administered medications for this visit.    Allergies  Allergen Reactions   Diazepam Rash   Tetracycline Nausea Only    Family History  Problem Relation Age of Onset   Healthy Mother    Drug abuse Father    Depression Father    Depression Brother    Diabetes type II Brother    Hypertension Brother    Glaucoma Brother    Glaucoma Paternal Grandfather    Colon cancer Neg Hx    Breast cancer Neg Hx    Ovarian cancer Neg Hx     Social History   Socioeconomic History   Marital status: Married    Spouse name: Not on file   Number of children: Not on file   Years of education: Not on file   Highest education level: Not on file  Occupational History   Not on file  Tobacco Use   Smoking status: Never   Smokeless tobacco: Never  Vaping Use    Vaping Use: Never used  Substance and Sexual Activity   Alcohol use: Not on file   Drug use: Never   Sexual activity: Not on file  Other Topics Concern   Not on file  Social History Narrative   Not on file   Social Determinants of Health   Financial Resource Strain: Not on file  Food Insecurity: Not on file  Transportation Needs: Not on file  Physical Activity: Not on file  Stress: Not on file  Social Connections: Not on file  Intimate Partner Violence: Not on file     Constitutional: Denies fever, malaise, fatigue, headache or abrupt weight changes.  HEENT: Denies eye pain, eye redness, ear pain, ringing in the ears, wax buildup, runny nose, nasal congestion, bloody nose, or sore throat. Respiratory: Denies difficulty breathing, shortness of breath, cough or sputum production.   Cardiovascular: Denies chest pain, chest tightness, palpitations or swelling in the hands or feet.  Gastrointestinal: Patient reports intermittent diarrhea.  Denies abdominal pain, bloating, constipation, or blood in the stool.  GU: Patient reports vaginal dryness.  Denies urgency, frequency, pain with urination, burning sensation, blood in urine, odor or discharge. Musculoskeletal: Patient reports chronic muscle pain.  Denies decrease in range of motion, difficulty with gait, or joint pain and swelling.  Skin: Denies redness, rashes, lesions or ulcercations.  Neurological: Patient reports insomnia.  Denies dizziness, difficulty with memory, difficulty with speech or problems with balance and coordination.  Psych: Patient has a history of anxiety and depression.  Denies SI/HI.  No other specific complaints in a complete review of systems (except as listed in HPI above).  Objective:   Physical Exam   BP 112/60 (BP Location: Left Arm, Patient Position: Sitting, Cuff Size: Normal)   Pulse 62   Temp (!) 96.6 F (35.9 C) (Temporal)   Ht 5' 10"$  (1.778 m)   Wt 143 lb (64.9 kg)   SpO2 100%   BMI  20.52 kg/m   Wt Readings from Last 3 Encounters:  12/01/21 151 lb (68.5 kg)  05/21/21 140 lb (63.5 kg)    General: Appears her stated age, well developed, well nourished in NAD. Skin: Warm, dry and intact. No rashes, lesions or ulcerations noted. HEENT: Head: normal shape and size; Eyes: sclera white, no icterus, conjunctiva pink, PERRLA and EOMs intact;  Neck:  Neck supple, trachea midline. No masses, lumps or thyromegaly present.  Cardiovascular: Normal rate and rhythm. S1,S2 noted.  No murmur, rubs or gallops noted. No JVD or BLE edema. No carotid bruits noted. Pulmonary/Chest: Normal effort and positive vesicular breath sounds. No respiratory distress. No wheezes, rales or ronchi noted.  Abdomen: Normal bowel sounds.  Musculoskeletal: Strength 5/5 BUE/BLE.  No difficulty with gait.  Neurological: Alert and oriented. Cranial nerves II-XII grossly intact. Coordination normal.  Psychiatric: Mood and affect normal. Behavior is normal. Judgment and thought content normal.    BMET    Component Value Date/Time   NA 139 05/21/2021 1114   NA 139 06/10/2013 2005   K 4.4 05/21/2021 1114   K 3.5 06/10/2013 2005   CL 103 05/21/2021 1114   CL 106 06/10/2013 2005   CO2 32 05/21/2021 1114   CO2 25 06/10/2013 2005   GLUCOSE 88 05/21/2021 1114   GLUCOSE 111 (H) 06/10/2013 2005   BUN 9 05/21/2021 1114   BUN 11 06/10/2013 2005   CREATININE 0.94 05/21/2021 1114   CALCIUM 9.5 05/21/2021 1114   CALCIUM 8.5 06/10/2013 2005   GFRNONAA >60 06/10/2013 2005   GFRAA >60 06/10/2013 2005    Lipid Panel     Component Value Date/Time   CHOL 175 12/01/2021 1104   TRIG 140 12/01/2021 1104   HDL 49 (L) 12/01/2021 1104   CHOLHDL 3.6 12/01/2021 1104   LDLCALC 102 (H) 12/01/2021 1104    CBC    Component Value Date/Time   WBC 6.3 12/01/2021 1104   RBC 5.05 12/01/2021 1104   HGB 15.4 12/01/2021 1104   HGB 13.1 06/10/2013 2005   HCT 45.7 (H) 12/01/2021 1104   HCT 39.0 06/10/2013 2005   PLT 192  12/01/2021 1104   PLT 139 (L) 06/10/2013 2005   MCV 90.5 12/01/2021 1104   MCV 86 06/10/2013 2005   MCH 30.5 12/01/2021 1104   MCHC 33.7 12/01/2021 1104   RDW 12.7 12/01/2021 1104   RDW 14.6 (H) 06/10/2013 2005   LYMPHSABS 0.2 (L) 06/10/2013 2005   MONOABS 0.3 06/10/2013 2005   EOSABS 0.0 06/10/2013 2005   BASOSABS 0.0 06/10/2013 2005    Hgb A1C Lab Results  Component Value Date   HGBA1C 5.1 12/01/2021           Assessment & Plan:   Preventative Health Maintenance:  Encouraged her to get a flu shot in the fall Tetanus UTD Encouraged her to get her COVID-vaccine Encouraged her to get her second Shingrix vaccine Pap smear UTD Mammogram ordered-she will call to schedule Colon screening UTD Encouraged her to consume a balanced diet and exercise regimen Advised to see an eye doctor and dentist annually We will check CBC, c-Met, lipid and A1c today  RTC in 6 months, follow-up chronic conditions Webb Silversmith, NP

## 2022-05-26 LAB — CBC
HCT: 42.1 % (ref 35.0–45.0)
Hemoglobin: 14.6 g/dL (ref 11.7–15.5)
MCH: 30.4 pg (ref 27.0–33.0)
MCHC: 34.7 g/dL (ref 32.0–36.0)
MCV: 87.7 fL (ref 80.0–100.0)
MPV: 11.2 fL (ref 7.5–12.5)
Platelets: 161 10*3/uL (ref 140–400)
RBC: 4.8 10*6/uL (ref 3.80–5.10)
RDW: 13 % (ref 11.0–15.0)
WBC: 3.8 10*3/uL (ref 3.8–10.8)

## 2022-05-26 LAB — COMPLETE METABOLIC PANEL WITH GFR
AG Ratio: 1.7 (calc) (ref 1.0–2.5)
ALT: 11 U/L (ref 6–29)
AST: 18 U/L (ref 10–35)
Albumin: 4.3 g/dL (ref 3.6–5.1)
Alkaline phosphatase (APISO): 95 U/L (ref 37–153)
BUN: 11 mg/dL (ref 7–25)
CO2: 29 mmol/L (ref 20–32)
Calcium: 9 mg/dL (ref 8.6–10.4)
Chloride: 102 mmol/L (ref 98–110)
Creat: 0.79 mg/dL (ref 0.50–1.03)
Globulin: 2.5 g/dL (calc) (ref 1.9–3.7)
Glucose, Bld: 87 mg/dL (ref 65–99)
Potassium: 4.1 mmol/L (ref 3.5–5.3)
Sodium: 140 mmol/L (ref 135–146)
Total Bilirubin: 0.5 mg/dL (ref 0.2–1.2)
Total Protein: 6.8 g/dL (ref 6.1–8.1)
eGFR: 89 mL/min/{1.73_m2} (ref >=60)

## 2022-05-26 LAB — LIPID PANEL
Cholesterol: 150 mg/dL (ref <200)
HDL: 40 mg/dL — ABNORMAL LOW (ref >=50)
LDL Cholesterol (Calc): 87 mg/dL (calc)
Non-HDL Cholesterol (Calc): 110 mg/dL (calc) (ref <130)
Total CHOL/HDL Ratio: 3.8 (calc) (ref <5.0)
Triglycerides: 135 mg/dL (ref <150)

## 2022-05-26 LAB — HEMOGLOBIN A1C
Hgb A1c MFr Bld: 5.3 % of total Hgb (ref <5.7)
Mean Plasma Glucose: 105 mg/dL
eAG (mmol/L): 5.8 mmol/L

## 2022-07-20 ENCOUNTER — Other Ambulatory Visit: Payer: Self-pay | Admitting: Internal Medicine

## 2022-07-21 MED ORDER — ESZOPICLONE 3 MG PO TABS: 3.0000 mg | ORAL_TABLET | Freq: Every day | ORAL | 0 refills | Status: DC

## 2022-07-21 MED ORDER — ESCITALOPRAM OXALATE 20 MG PO TABS: 20.0000 mg | ORAL_TABLET | Freq: Every day | ORAL | 0 refills | Status: DC

## 2022-08-16 NOTE — Telephone Encounter (Signed)
This encounter was created in error - please disregard.

## 2022-09-09 ENCOUNTER — Other Ambulatory Visit: Payer: Self-pay | Admitting: Internal Medicine

## 2022-09-09 MED ORDER — ESZOPICLONE 3 MG PO TABS: 3.0000 mg | ORAL_TABLET | Freq: Every day | ORAL | 0 refills | Status: DC

## 2022-11-06 ENCOUNTER — Other Ambulatory Visit: Payer: Self-pay | Admitting: Internal Medicine

## 2022-11-08 NOTE — Telephone Encounter (Signed)
Requested Prescriptions  Pending Prescriptions Disp Refills   escitalopram (LEXAPRO) 20 MG tablet [Pharmacy Med Name: ESCITALOPRAM TAB 20MG ] 90 tablet 0    Sig: TAKE 1 TABLET DAILY     Psychiatry:  Antidepressants - SSRI Passed - 11/06/2022  8:13 AM      Passed - Completed PHQ-2 or PHQ-9 in the last 360 days      Passed - Valid encounter within last 6 months    Recent Outpatient Visits           5 months ago Encounter for general adult medical examination with abnormal findings   [place] [facility] [place], Salvadore Oxford, NP   11 months ago Screening for colon cancer   [place] [facility] [place], Salvadore Oxford, NP   1 year ago Encounter for general adult medical examination with abnormal findings   [facility] [place], Salvadore Oxford, NP

## 2022-12-20 DIAGNOSIS — N6310 Unspecified lump in the right breast, unspecified quadrant: Secondary | ICD-10-CM | POA: Diagnosis not present

## 2022-12-20 DIAGNOSIS — Z1231 Encounter for screening mammogram for malignant neoplasm of breast: Secondary | ICD-10-CM | POA: Diagnosis not present

## 2022-12-20 DIAGNOSIS — N6320 Unspecified lump in the left breast, unspecified quadrant: Secondary | ICD-10-CM | POA: Diagnosis not present

## 2022-12-20 LAB — HM MAMMOGRAPHY

## 2023-01-28 ENCOUNTER — Other Ambulatory Visit: Payer: Self-pay | Admitting: Internal Medicine

## 2023-01-28 NOTE — Telephone Encounter (Signed)
Called pt to schedule follow up appt - left message on machine.

## 2023-01-28 NOTE — Telephone Encounter (Signed)
Courtesy refill. Patient will need an office visit for further refills. Requested Prescriptions  Pending Prescriptions Disp Refills   escitalopram (LEXAPRO) 20 MG tablet [Pharmacy Med Name: ESCITALOPRAM TAB 20MG ] 30 tablet 0    Sig: TAKE 1 TABLET DAILY     Psychiatry:  Antidepressants - SSRI Failed - 01/28/2023  2:15 AM      Failed - Valid encounter within last 6 months    Recent Outpatient Visits           8 months ago Encounter for general adult medical examination with abnormal findings   [place] [facility] [place], Salvadore Oxford, NP   1 year ago Screening for colon cancer   [place] [facility] [place], Salvadore Oxford, NP   1 year ago Encounter for general adult medical examination with abnormal findings   [place] [facility] [place], Salvadore Oxford, NP              Passed - Completed PHQ-2 or PHQ-9 in the last 360 days

## 2023-03-13 ENCOUNTER — Other Ambulatory Visit: Payer: Self-pay | Admitting: Internal Medicine

## 2023-03-16 NOTE — Telephone Encounter (Signed)
Requested medication (s) are due for refill today: Yes  Requested medication (s) are on the active medication list: Yes  Last refill:  01/28/23  Future visit scheduled: No (pt has been notified via MyChart appt is needed)  Notes to clinic:  Unable to refill per protocol, courtesy refill already given, routing for provider approval.      Requested Prescriptions  Pending Prescriptions Disp Refills   escitalopram (LEXAPRO) 20 MG tablet [Pharmacy Med Name: ESCITALOPRAM TAB 20MG ] 30 tablet 0    Sig: TAKE 1 TABLET DAILY     Psychiatry:  Antidepressants - SSRI Failed - 03/13/2023  8:12 AM      Failed - Valid encounter within last 6 months    Recent Outpatient Visits           9 months ago Encounter for general adult medical examination with abnormal findings   [place] [facility], Salvadore Oxford, NP   1 year ago Screening for colon cancer   [place] [facility] [place], Salvadore Oxford, NP   1 year ago Encounter for general adult medical examination with abnormal findings   [place] [facility] [place], Salvadore Oxford, NP              Passed - Completed PHQ-2 or PHQ-9 in the last 360 days

## 2023-05-10 ENCOUNTER — Encounter: Payer: BC Managed Care – PPO | Admitting: Internal Medicine

## 2023-05-31 ENCOUNTER — Encounter: Payer: Self-pay | Admitting: Internal Medicine

## 2023-05-31 ENCOUNTER — Ambulatory Visit (INDEPENDENT_AMBULATORY_CARE_PROVIDER_SITE_OTHER): Payer: BC Managed Care – PPO | Admitting: Internal Medicine

## 2023-05-31 VITALS — BP 112/68 | Ht 70.0 in | Wt 159.4 lb

## 2023-05-31 DIAGNOSIS — R739 Hyperglycemia, unspecified: Secondary | ICD-10-CM

## 2023-05-31 DIAGNOSIS — Z136 Encounter for screening for cardiovascular disorders: Secondary | ICD-10-CM | POA: Diagnosis not present

## 2023-05-31 DIAGNOSIS — Z0001 Encounter for general adult medical examination with abnormal findings: Secondary | ICD-10-CM

## 2023-05-31 MED ORDER — CYCLOBENZAPRINE HCL 10 MG PO TABS: ORAL_TABLET | ORAL | 0 refills | Status: DC

## 2023-05-31 MED ORDER — ESCITALOPRAM OXALATE 20 MG PO TABS: 20.0000 mg | ORAL_TABLET | Freq: Every day | ORAL | 1 refills | Status: DC

## 2023-05-31 NOTE — Progress Notes (Signed)
 Subjective:    Patient ID: Briana Bauer, female    DOB: 02-12-1971, 53 y.o.   MRN: [id number]  HPI  Patient presents to clinic today for her annual exam.  Flu: 01/2021 Tetanus: 06/2013 COVID: never Shingrix: 02/2020 Pap smear: 03/2019 Mammogram: 12/2022 Colon screening: 02/2022, Cologuard Vision screening: as needed Dentist: biannually  Diet: She does eat meat. She consumes fruits and veggies. She tries to avoid fried foods. She drinks mostly water and coffee. Exercise: 20 minutes at home  Review of Systems     No past medical history on file.  Current Outpatient Medications  Medication Sig Dispense Refill   cyclobenzaprine (FLEXERIL) 10 MG tablet TAKE 1 TABLET DAILY AS     NEEDED FOR MUSCLE SPASMS 30 tablet 0   escitalopram (LEXAPRO) 20 MG tablet TAKE 1 TABLET DAILY 30 tablet 0   Eszopiclone 3 MG TABS Take 1 tablet (3 mg total) by mouth at bedtime. Take immediately before bedtime 30 tablet 0   Ibuprofen 200 MG CAPS Take by mouth.     L-Lysine 1000 MG TABS Take by mouth.     Magnesium 500 MG TABS Take by mouth.     No current facility-administered medications for this visit.    Allergies  Allergen Reactions   Diazepam Rash   Tetracycline Nausea Only    Family History  Problem Relation Age of Onset   Healthy Mother    Drug abuse Father    Depression Father    Depression Brother    Diabetes type II Brother    Hypertension Brother    Glaucoma Brother    Glaucoma Paternal Grandfather    Colon cancer Neg Hx    Breast cancer Neg Hx    Ovarian cancer Neg Hx     Social History   Socioeconomic History   Marital status: Married    Spouse name: Not on file   Number of children: Not on file   Years of education: Not on file   Highest education level: Associate degree: occupational, Scientist, product/process development, or vocational program  Occupational History   Not on file  Tobacco Use   Smoking status: Never   Smokeless tobacco: Never  Vaping Use   Vaping status: Never Used   Substance and Sexual Activity   Alcohol use: Not on file   Drug use: Never   Sexual activity: Not on file  Other Topics Concern   Not on file  Social History Narrative   Not on file   Social Drivers of Health   Financial Resource Strain: Low Risk  (05/30/2023)   Overall Financial Resource Strain (CARDIA)    Difficulty of Paying Living Expenses: Not very hard  Food Insecurity: No Food Insecurity (05/30/2023)   Hunger Vital Sign    Worried About Running Out of Food in the Last Year: Never true    Ran Out of Food in the Last Year: Never true  Transportation Needs: No Transportation Needs (05/30/2023)   PRAPARE - Administrator, Civil Service (Medical): No    Lack of Transportation (Non-Medical): No  Physical Activity: Insufficiently Active (05/30/2023)   Exercise Vital Sign    Days of Exercise per Week: 5 days    Minutes of Exercise per Session: 20 min  Stress: Stress Concern Present (05/30/2023)   [employer] of Occupational Health - Occupational Stress Questionnaire    Feeling of Stress : To some extent  Social Connections: Socially Integrated (05/30/2023)   Social Connection and Isolation Panel [NHANES]  Frequency of Communication with Friends and Family: More than three times a week    Frequency of Social Gatherings with Friends and Family: Once a week    Attends Religious Services: More than 4 times per year    Active Member of [employer] or Organizations: Yes    Attends Engineer, structural: More than 4 times per year    Marital Status: Married  Catering manager Violence: Not on file     Constitutional: Denies fever, malaise, fatigue, headache or abrupt weight changes.  HEENT: Denies eye pain, eye redness, ear pain, ringing in the ears, wax buildup, runny nose, nasal congestion, bloody nose, or sore throat. Respiratory: Denies difficulty breathing, shortness of breath, cough or sputum production.   Cardiovascular: Denies chest pain, chest tightness,  palpitations or swelling in the hands or feet.  Gastrointestinal: Patient reports intermittent diarrhea.  Denies abdominal pain, bloating, constipation, or blood in the stool.  GU: Denies urgency, frequency, pain with urination, burning sensation, blood in urine, odor or discharge. Musculoskeletal: Patient reports chronic muscle pain.  Denies decrease in range of motion, difficulty with gait, or joint pain and swelling.  Skin: Denies redness, rashes, lesions or ulcercations.  Neurological: Patient reports insomnia.  Denies dizziness, difficulty with memory, difficulty with speech or problems with balance and coordination.  Psych: Patient has a history of anxiety and depression.  Denies SI/HI.  No other specific complaints in a complete review of systems (except as listed in HPI above).  Objective:   Physical Exam  BP 112/68 (BP Location: Right Arm, Patient Position: Sitting, Cuff Size: Normal)   Ht 5\' 10"  (1.778 m)   Wt 159 lb 6.4 oz (72.3 kg)   BMI 22.87 kg/m    Wt Readings from Last 3 Encounters:  05/25/22 143 lb (64.9 kg)  12/01/21 151 lb (68.5 kg)  05/21/21 140 lb (63.5 kg)    General: Appears her stated age, well developed, well nourished in NAD. Skin: Warm, dry and intact. No rashes, lesions or ulcerations noted. HEENT: Head: normal shape and size; Eyes: sclera white, no icterus, conjunctiva pink, PERRLA and EOMs intact;  Neck:  Neck supple, trachea midline. No masses, lumps or thyromegaly present.  Cardiovascular: Normal rate and rhythm. S1,S2 noted.  No murmur, rubs or gallops noted. No JVD or BLE edema. No carotid bruits noted. Pulmonary/Chest: Normal effort and positive vesicular breath sounds. No respiratory distress. No wheezes, rales or ronchi noted.  Abdomen: Normal bowel sounds.  Musculoskeletal: Strength 5/5 BUE/BLE.  No difficulty with gait.  Neurological: Alert and oriented. Cranial nerves II-XII grossly intact. Coordination normal.  Psychiatric: Mood and affect  normal. Behavior is normal. Judgment and thought content normal.    BMET    Component Value Date/Time   NA 140 05/25/2022 1055   NA 139 06/10/2013 2005   K 4.1 05/25/2022 1055   K 3.5 06/10/2013 2005   CL 102 05/25/2022 1055   CL 106 06/10/2013 2005   CO2 29 05/25/2022 1055   CO2 25 06/10/2013 2005   GLUCOSE 87 05/25/2022 1055   GLUCOSE 111 (H) 06/10/2013 2005   BUN 11 05/25/2022 1055   BUN 11 06/10/2013 2005   CREATININE 0.79 05/25/2022 1055   CALCIUM 9.0 05/25/2022 1055   CALCIUM 8.5 06/10/2013 2005   GFRNONAA >60 06/10/2013 2005   GFRAA >60 06/10/2013 2005    Lipid Panel     Component Value Date/Time   CHOL 150 05/25/2022 1055   TRIG 135 05/25/2022 1055   HDL 40 (  L) 05/25/2022 1055   CHOLHDL 3.8 05/25/2022 1055   LDLCALC 87 05/25/2022 1055    CBC    Component Value Date/Time   WBC 3.8 05/25/2022 1055   RBC 4.80 05/25/2022 1055   HGB 14.6 05/25/2022 1055   HGB 13.1 06/10/2013 2005   HCT 42.1 05/25/2022 1055   HCT 39.0 06/10/2013 2005   PLT 161 05/25/2022 1055   PLT 139 (L) 06/10/2013 2005   MCV 87.7 05/25/2022 1055   MCV 86 06/10/2013 2005   MCH 30.4 05/25/2022 1055   MCHC 34.7 05/25/2022 1055   RDW 13.0 05/25/2022 1055   RDW 14.6 (H) 06/10/2013 2005   LYMPHSABS 0.2 (L) 06/10/2013 2005   MONOABS 0.3 06/10/2013 2005   EOSABS 0.0 06/10/2013 2005   BASOSABS 0.0 06/10/2013 2005    Hgb A1C Lab Results  Component Value Date   HGBA1C 5.3 05/25/2022           Assessment & Plan:   Preventative Health Maintenance:  Flu shot declined Tetanus UTD Encouraged her to get her COVID-vaccine Encouraged her to get her second Shingrix vaccine Pap smear UTD Mammogram UTD Colon screening UTD Encouraged her to consume a balanced diet and exercise regimen Advised to see an eye doctor and dentist annually We will check CBC, c-Met, lipid and A1c today  RTC in 6 months, follow-up chronic conditions Nicki Reaper, NP

## 2023-05-31 NOTE — Patient Instructions (Signed)

## 2023-06-01 ENCOUNTER — Encounter: Payer: Self-pay | Admitting: Internal Medicine

## 2023-06-01 LAB — CBC
HCT: 43 % (ref 35.0–45.0)
Hemoglobin: 14.5 g/dL (ref 11.7–15.5)
MCH: 30.2 pg (ref 27.0–33.0)
MCHC: 33.7 g/dL (ref 32.0–36.0)
MCV: 89.6 fL (ref 80.0–100.0)
MPV: 11.7 fL (ref 7.5–12.5)
Platelets: 182 10*3/uL (ref 140–400)
RBC: 4.8 10*6/uL (ref 3.80–5.10)
RDW: 12.5 % (ref 11.0–15.0)
WBC: 3.5 10*3/uL — ABNORMAL LOW (ref 3.8–10.8)

## 2023-06-01 LAB — COMPLETE METABOLIC PANEL WITH GFR
AG Ratio: 1.9 (calc) (ref 1.0–2.5)
ALT: 14 U/L (ref 6–29)
AST: 19 U/L (ref 10–35)
Albumin: 4.4 g/dL (ref 3.6–5.1)
Alkaline phosphatase (APISO): 105 U/L (ref 37–153)
BUN: 13 mg/dL (ref 7–25)
CO2: 29 mmol/L (ref 20–32)
Calcium: 9.6 mg/dL (ref 8.6–10.4)
Chloride: 103 mmol/L (ref 98–110)
Creat: 0.92 mg/dL (ref 0.50–1.03)
Globulin: 2.3 g/dL (ref 1.9–3.7)
Glucose, Bld: 86 mg/dL (ref 65–139)
Potassium: 4.4 mmol/L (ref 3.5–5.3)
Sodium: 140 mmol/L (ref 135–146)
Total Bilirubin: 0.6 mg/dL (ref 0.2–1.2)
Total Protein: 6.7 g/dL (ref 6.1–8.1)
eGFR: 74 mL/min/{1.73_m2} (ref >=60)

## 2023-06-01 LAB — HEMOGLOBIN A1C
Hgb A1c MFr Bld: 5.2 %{Hb} (ref <5.7)
Mean Plasma Glucose: 103 mg/dL
eAG (mmol/L): 5.7 mmol/L

## 2023-06-01 LAB — LIPID PANEL
Cholesterol: 182 mg/dL (ref <200)
HDL: 58 mg/dL (ref >=50)
LDL Cholesterol (Calc): 110 mg/dL — ABNORMAL HIGH
Non-HDL Cholesterol (Calc): 124 mg/dL (ref <130)
Total CHOL/HDL Ratio: 3.1 (calc) (ref <5.0)
Triglycerides: 59 mg/dL (ref <150)

## 2023-08-02 ENCOUNTER — Other Ambulatory Visit: Payer: Self-pay | Admitting: Internal Medicine

## 2023-08-03 MED ORDER — CYCLOBENZAPRINE HCL 10 MG PO TABS: ORAL_TABLET | ORAL | 0 refills | Status: DC

## 2023-08-30 ENCOUNTER — Other Ambulatory Visit: Payer: Self-pay | Admitting: Internal Medicine

## 2023-08-31 NOTE — Telephone Encounter (Signed)
 Requested medication (s) are due for refill today - yes  Requested medication (s) are on the active medication list -yes  Future visit scheduled -no  Last refill: 08/03/23 #30   Notes to clinic: non delegated Rx  Requested Prescriptions  Pending Prescriptions Disp Refills   cyclobenzaprine  (FLEXERIL ) 10 MG tablet [Pharmacy Med Name: CYCLOBENZAPRINE  10 MG TABLET] 30 tablet 0    Sig: TAKE 1 TABLET DAILY AS     NEEDED FOR MUSCLE SPASMS     Not Delegated - Analgesics:  Muscle Relaxants Failed - 08/31/2023  2:57 PM      Failed - This refill cannot be delegated      Passed - Valid encounter within last 6 months    Recent Outpatient Visits           3 months ago Encounter for general adult medical examination with abnormal findings   [place] [facility] [place], Rankin Buzzard, NP                 Requested Prescriptions  Pending Prescriptions Disp Refills   cyclobenzaprine  (FLEXERIL ) 10 MG tablet [Pharmacy Med Name: CYCLOBENZAPRINE  10 MG TABLET] 30 tablet 0    Sig: TAKE 1 TABLET DAILY AS     NEEDED FOR MUSCLE SPASMS     Not Delegated - Analgesics:  Muscle Relaxants Failed - 08/31/2023  2:57 PM      Failed - This refill cannot be delegated      Passed - Valid encounter within last 6 months    Recent Outpatient Visits           3 months ago Encounter for general adult medical examination with abnormal findings   [facility] [place], Rankin Buzzard, [place]

## 2023-09-28 ENCOUNTER — Other Ambulatory Visit: Payer: Self-pay | Admitting: Internal Medicine

## 2023-09-30 NOTE — Telephone Encounter (Signed)
 Requested medications are due for refill today.  yes  Requested medications are on the active medications list.  yes  Last refill. 08/31/2023 #30 0 rf  Future visit scheduled.   no  Notes to clinic.  Refill not delegated.    Requested Prescriptions  Pending Prescriptions Disp Refills   cyclobenzaprine  (FLEXERIL ) 10 MG tablet [Pharmacy Med Name: CYCLOBENZAPRINE  10 MG TABLET] 30 tablet 0    Sig: TAKE 1 TABLET DAILY AS NEEDED FOR MUSCLE SPASMS     Not Delegated - Analgesics:  Muscle Relaxants Failed - 09/30/2023 11:52 AM      Failed - This refill cannot be delegated      Passed - Valid encounter within last 6 months    Recent Outpatient Visits           4 months ago Encounter for general adult medical examination with abnormal findings   [place] [facility] [place], Rankin Buzzard, NP

## 2023-10-31 ENCOUNTER — Other Ambulatory Visit: Payer: Self-pay | Admitting: Internal Medicine

## 2023-11-02 NOTE — Telephone Encounter (Signed)
 Requested medication (s) are due for refill today: yes  Requested medication (s) are on the active medication list: yes  Last refill:  09/30/23  Future visit scheduled: no  Notes to clinic:  Unable to refill per protocol, cannot delegate.      Requested Prescriptions  Pending Prescriptions Disp Refills   cyclobenzaprine  (FLEXERIL ) 10 MG tablet [Pharmacy Med Name: CYCLOBENZAPRINE  10 MG TABLET] 30 tablet 0    Sig: TAKE 1 TABLET DAILY AS NEEDED FOR MUSCLE SPASMS     Not Delegated - Analgesics:  Muscle Relaxants Failed - 11/02/2023 10:46 AM      Failed - This refill cannot be delegated      Passed - Valid encounter within last 6 months    Recent Outpatient Visits           5 months ago Encounter for general adult medical examination with abnormal findings   [place] [facility] [place], Angeline ORN, NP

## 2023-12-05 ENCOUNTER — Other Ambulatory Visit: Payer: Self-pay | Admitting: Internal Medicine

## 2023-12-06 NOTE — Telephone Encounter (Signed)
 Requested medication (s) are due for refill today -yes  Requested medication (s) are on the active medication list -yes  Future visit scheduled -no  Last refill: 11/02/23 #30  Notes to clinic: non delegated Rx  Requested Prescriptions  Pending Prescriptions Disp Refills   cyclobenzaprine  (FLEXERIL ) 10 MG tablet [Pharmacy Med Name: CYCLOBENZAPRINE  10 MG TABLET] 30 tablet 0    Sig: TAKE 1 TABLET DAILY AS NEEDED FOR MUSCLE SPASMS     Not Delegated - Analgesics:  Muscle Relaxants Failed - 12/06/2023  1:47 PM      Failed - This refill cannot be delegated      Failed - Valid encounter within last 6 months    Recent Outpatient Visits           6 months ago Encounter for general adult medical examination with abnormal findings   [place] [facility], Angeline ORN, NP                 Requested Prescriptions  Pending Prescriptions Disp Refills   cyclobenzaprine  (FLEXERIL ) 10 MG tablet [Pharmacy Med Name: CYCLOBENZAPRINE  10 MG TABLET] 30 tablet 0    Sig: TAKE 1 TABLET DAILY AS NEEDED FOR MUSCLE SPASMS     Not Delegated - Analgesics:  Muscle Relaxants Failed - 12/06/2023  1:47 PM      Failed - This refill cannot be delegated      Failed - Valid encounter within last 6 months    Recent Outpatient Visits           6 months ago Encounter for general adult medical examination with abnormal findings   [facility] [place], Angeline ORN, [place]

## 2024-01-02 ENCOUNTER — Other Ambulatory Visit: Payer: Self-pay | Admitting: Internal Medicine

## 2024-01-03 NOTE — Telephone Encounter (Signed)
 Courtesy refill. Patient will need an office visit for additional refills.  Requested Prescriptions  Pending Prescriptions Disp Refills   escitalopram  (LEXAPRO ) 20 MG tablet [Pharmacy Med Name: ESCITALOPRAM  TAB 20MG ] 30 tablet 0    Sig: TAKE 1 TABLET DAILY     Psychiatry:  Antidepressants - SSRI Failed - 01/03/2024  2:39 PM      Failed - Valid encounter within last 6 months    Recent Outpatient Visits           7 months ago Encounter for general adult medical examination with abnormal findings   [place] [facility] [place], [place], NP              Passed - Completed PHQ-2 or PHQ-9 in the last 360 days

## 2024-01-04 ENCOUNTER — Encounter: Payer: Self-pay | Admitting: Internal Medicine

## 2024-01-05 ENCOUNTER — Other Ambulatory Visit: Payer: Self-pay

## 2024-01-05 MED ORDER — ESCITALOPRAM OXALATE 20 MG PO TABS: 20.0000 mg | ORAL_TABLET | Freq: Every day | ORAL | 0 refills | Status: DC

## 2024-01-20 ENCOUNTER — Other Ambulatory Visit: Payer: Self-pay | Admitting: Internal Medicine

## 2024-01-23 NOTE — Telephone Encounter (Signed)
 Requested Prescriptions  Refused Prescriptions Disp Refills   escitalopram  (LEXAPRO ) 20 MG tablet [Pharmacy Med Name: ESCITALOPRAM  TAB 20MG ] 30 tablet 0    Sig: TAKE 1 TABLET DAILY     Psychiatry:  Antidepressants - SSRI Failed - 01/23/2024  2:32 PM      Failed - Valid encounter within last 6 months    Recent Outpatient Visits           7 months ago Encounter for general adult medical examination with abnormal findings   [place] [facility] [place], [place], NP              Passed - Completed PHQ-2 or PHQ-9 in the last 360 days

## 2024-04-12 ENCOUNTER — Other Ambulatory Visit: Payer: Self-pay | Admitting: Internal Medicine

## 2024-04-13 NOTE — Telephone Encounter (Signed)
 Requested medications are due for refill today.  yes  Requested medications are on the active medications list.  yes  Last refill. 01/05/2024 #90 0 rf  Future visit scheduled.   no  Notes to clinic.  Per ov of 05/31/2023, pt was to rtc in 6 months.    Requested Prescriptions  Pending Prescriptions Disp Refills   escitalopram  (LEXAPRO ) 20 MG tablet [Pharmacy Med Name: ESCITALOPRAM  TAB 20MG ] 90 tablet 0    Sig: TAKE 1 TABLET DAILY     Psychiatry:  Antidepressants - SSRI Failed - 04/13/2024 12:11 PM      Failed - Valid encounter within last 6 months    Recent Outpatient Visits           10 months ago Encounter for general adult medical examination with abnormal findings   [place] [facility] [place], [place], NP              Passed - Completed PHQ-2 or PHQ-9 in the last 360 days

## 2024-05-11 ENCOUNTER — Encounter: Payer: Self-pay | Admitting: Internal Medicine

## 2024-05-11 MED ORDER — ESCITALOPRAM OXALATE 20 MG PO TABS: 20.0000 mg | ORAL_TABLET | Freq: Every day | ORAL | 0 refills | Status: AC

## 2024-06-01 ENCOUNTER — Encounter: Admitting: Internal Medicine
